# Patient Record
Sex: Female | Born: 1980 | Race: White | Hispanic: No | Marital: Single | Smoking: Former smoker
Health system: Southern US, Community
[De-identification: ages and names within clinical notes are randomized; demographics above are authoritative.]

## PROBLEM LIST (undated history)

## (undated) DIAGNOSIS — J45909 Unspecified asthma, uncomplicated: Secondary | ICD-10-CM

---

## 2012-04-18 ENCOUNTER — Emergency Department (INDEPENDENT_AMBULATORY_CARE_PROVIDER_SITE_OTHER): Payer: Medicaid Other

## 2012-04-18 ENCOUNTER — Encounter (HOSPITAL_COMMUNITY): Payer: Self-pay

## 2012-04-18 ENCOUNTER — Emergency Department (HOSPITAL_COMMUNITY)
Admission: EM | Admit: 2012-04-18 | Discharge: 2012-04-18 | Disposition: A | Discharge status: Home / Self Care | Payer: Medicaid Other | Source: Home / Self Care | Attending: Emergency Medicine | Admitting: Emergency Medicine

## 2012-04-18 ENCOUNTER — Emergency Department (HOSPITAL_COMMUNITY): Admission: EM | Admit: 2012-04-18 | Discharge: 2012-04-18 | Discharge status: Against Medical Advice | Payer: Self-pay

## 2012-04-18 DIAGNOSIS — S93409A Sprain of unspecified ligament of unspecified ankle, initial encounter: Secondary | ICD-10-CM

## 2012-04-18 MED ORDER — ONDANSETRON 4 MG PO TBDP
ORAL_TABLET | ORAL | Status: AC
  Filled 2012-04-18: qty 2

## 2012-04-18 MED ORDER — HYDROCODONE-ACETAMINOPHEN 5-325 MG PO TABS
1.0000 | ORAL_TABLET | Freq: Once | ORAL | Status: AC
Start: 2012-04-18 — End: 2012-04-18
  Administered 2012-04-18: 1 via ORAL

## 2012-04-18 MED ORDER — HYDROCODONE-ACETAMINOPHEN 5-325 MG PO TABS: ORAL_TABLET | ORAL | Status: DC

## 2012-04-18 MED ORDER — HYDROCODONE-ACETAMINOPHEN 5-325 MG PO TABS
ORAL_TABLET | ORAL | Status: AC
  Filled 2012-04-18: qty 1

## 2012-04-18 MED ORDER — ONDANSETRON 8 MG PO TBDP: 8.0000 mg | ORAL_TABLET | Freq: Three times a day (TID) | ORAL | Status: DC | PRN

## 2012-04-18 MED ORDER — ONDANSETRON 4 MG PO TBDP
8.0000 mg | ORAL_TABLET | Freq: Once | ORAL | Status: AC
  Administered 2012-04-18: 8 mg via ORAL

## 2012-04-18 NOTE — ED Provider Notes (Signed)
Chief Complaint  Patient presents with  . Foot Injury    History of Present Illness:   Sabrina Gonzalez is a 32 year old female who injured her right ankle last night. She had her foot propped up in a recliner, when her young son jumped and landed on her ankle. It was somewhat painful thereafter and swollen. It hurts to walk and she had a great deal difficulty bearing weight. Her toes are little numb and tingly. Her son did the same thing again this morning and reinjured it again. This made it hurt even worse.  Review of Systems:  Other than noted above, the patient denies any of the following symptoms: Systemic:  No fevers, chills, sweats, or aches.   Musculoskeletal:  No joint pain, arthritis, bursitis, swelling, back pain, or neck pain. Neurological:  No muscular weakness or paresthesias.  PMFSH:  Past medical history, family history, social history, meds, and allergies were reviewed.  Physical Exam:   Vital signs:  BP 109/75  Pulse 70  Temp 97.6 F (36.4 C) (Oral)  Resp 16  Ht 5\' 10"  (1.778 m)  Wt 200 lb (90.719 kg)  BMI 28.70 kg/m2  SpO2 99%  LMP 04/06/2012 Gen:  Alert and oriented times 3.  In no distress. Musculoskeletal: Exam of the ankle reveals slight swelling both medially and laterally. There was pain to palpation just beneath the lateral malleolus and anteriorly, but not over the foot itself. There was no bruising or deformity. Anterior drawer sign was negative.  Talar tilt was negative. Squeeze test was negative. Achilles tendon, peroneal tendon, and tibialis posterior were intact. Otherwise, all joints had a full a ROM with no swelling, bruising or deformity.  No edema, pulses full. Extremities were warm and pink.  Capillary refill was brisk.  Skin:  Clear, warm and dry.  No rash. Neuro:  Alert and oriented times 3.  Muscle strength was normal.  Sensation was intact to light touch.   Radiology:  Dg Ankle Complete Right  04/18/2012  *RADIOLOGY REPORT*  Clinical Data:  32 year old female with right ankle pain following injury.  RIGHT ANKLE - COMPLETE 3+ VIEW  Comparison: None  Findings: No evidence of acute fracture, subluxation or dislocation identified.  No radio-opaque foreign bodies are present.  No focal bony lesions are noted.  The joint spaces are unremarkable.  IMPRESSION: No evidence of acute bony abnormality.   Original Report Authenticated By: Harmon Pier, M.D.    I reviewed the images independently and personally and concur with the radiologist's findings.  Course in Urgent Care Center:   She was placed in an ASO brace and given crutches for ambulation. For the pain she was given hydrocodone/APAP 5/325 one by mouth and since this usually makes her a little nauseated, Zofran ODT 8 mg sublingually.  Assessment:  The encounter diagnosis was Ankle sprain.  Plan:   1.  The following meds were prescribed:   New Prescriptions   HYDROCODONE-ACETAMINOPHEN (NORCO/VICODIN) 5-325 MG PER TABLET    1 to 2 tabs every 4 to 6 hours as needed for pain.   ONDANSETRON (ZOFRAN ODT) 8 MG DISINTEGRATING TABLET    Take 1 tablet (8 mg total) by mouth every 8 (eight) hours as needed for nausea.   2.  The patient was instructed in symptomatic care, including rest and activity, elevation, application of ice and compression.  Appropriate handouts were given. 3.  The patient was told to return if becoming worse in any way, if no better in 3 or 4  days, and given some red flag symptoms that would indicate earlier return.   4.  The patient was told to follow up with Dr. Jodi Geralds if no better in 2 weeks.    Reuben Likes, MD 04/18/12 [phone]

## 2012-04-18 NOTE — ED Notes (Signed)
States her son jumped and landed on her right foot/ankle last night and again today ; minimal swelling , painful to palpation ; good dorsal pedal and posterior tibial pulses; echymosis absent

## 2012-06-21 ENCOUNTER — Emergency Department (HOSPITAL_COMMUNITY)
Admission: EM | Admit: 2012-06-21 | Discharge: 2012-06-22 | Disposition: A | Discharge status: Home / Self Care | Payer: Medicaid Other | Attending: Emergency Medicine | Admitting: Emergency Medicine

## 2012-06-21 ENCOUNTER — Encounter (HOSPITAL_COMMUNITY): Payer: Self-pay | Admitting: *Deleted

## 2012-06-21 DIAGNOSIS — R1084 Generalized abdominal pain: Secondary | ICD-10-CM | POA: Insufficient documentation

## 2012-06-21 DIAGNOSIS — J45909 Unspecified asthma, uncomplicated: Secondary | ICD-10-CM | POA: Insufficient documentation

## 2012-06-21 DIAGNOSIS — Z3202 Encounter for pregnancy test, result negative: Secondary | ICD-10-CM | POA: Insufficient documentation

## 2012-06-21 DIAGNOSIS — Z87891 Personal history of nicotine dependence: Secondary | ICD-10-CM | POA: Insufficient documentation

## 2012-06-21 DIAGNOSIS — R109 Unspecified abdominal pain: Secondary | ICD-10-CM

## 2012-06-21 HISTORY — DX: Unspecified asthma, uncomplicated: J45.909

## 2012-06-21 LAB — URINALYSIS, ROUTINE W REFLEX MICROSCOPIC
Ketones, ur: 15 mg/dL — AB
Leukocytes, UA: NEGATIVE
Nitrite: NEGATIVE
Protein, ur: NEGATIVE mg/dL
Urobilinogen, UA: 1 mg/dL (ref 0.0–1.0)

## 2012-06-21 LAB — CBC WITH DIFFERENTIAL/PLATELET
Basophils Relative: 1 % (ref 0–1)
Eosinophils Relative: 1 % (ref 0–5)
HCT: 38.7 % (ref 36.0–46.0)
Hemoglobin: 13.5 g/dL (ref 12.0–15.0)
MCHC: 34.9 g/dL (ref 30.0–36.0)
MCV: 87.6 fL (ref 78.0–100.0)
Monocytes Absolute: 0.6 10*3/uL (ref 0.1–1.0)
Monocytes Relative: 10 % (ref 3–12)
Neutro Abs: 4.1 10*3/uL (ref 1.7–7.7)
RDW: 13.1 % (ref 11.5–15.5)
WBC: 6.2 10*3/uL (ref 4.0–10.5)

## 2012-06-21 LAB — COMPREHENSIVE METABOLIC PANEL
BUN: 12 mg/dL (ref 6–23)
CO2: 26 mEq/L (ref 19–32)
Calcium: 9.3 mg/dL (ref 8.4–10.5)
Chloride: 101 mEq/L (ref 96–112)
Creatinine, Ser: 0.8 mg/dL (ref 0.50–1.10)
GFR calc non Af Amer: >90 mL/min (ref >90)
Total Bilirubin: 0.4 mg/dL (ref 0.3–1.2)

## 2012-06-21 LAB — LIPASE, BLOOD: Lipase: 14 U/L (ref 11–59)

## 2012-06-21 NOTE — ED Notes (Signed)
Abdominal pain for over a week 

## 2012-06-21 NOTE — ED Provider Notes (Signed)
History     This chart was scribed for Geoffery Lyons, MD, MD by Smitty Pluck, ED Scribe. The patient was seen in room [facility]10/[facility] and the patient's care was started at 11:18PM.   CSN: [id number]  Arrival date & time 06/21/12  2232      Chief Complaint  Patient presents with  . Abdominal Pain     The history is provided by the patient and a friend. No language interpreter was used.   Sabrina Gonzalez is a 32 y.o. female who presents to the Emergency Department complaining of constant, moderate generalized abdominal pain onset 1.5 weeks. She states that the pain is intermittently sharp and sometimes it is an aching pain. She states that she had abdominal cramps initially but those subsided since onset. She reports that she has lost 15 lbs within the last 1.5 weeks. She reports that she has decreased appetite. Pt reports that she was seen at Urgent Care and was told she had a virus. Pt denies blood in stool,  Fever, back pain, chills, nausea, vomiting, diarrhea, weakness, cough, SOB and any other pain. She denies hx of similar pain and abdominal surgery.    Past Medical History  Diagnosis Date  . Asthma     History reviewed. No pertinent past surgical history.  No family history on file.  History  Substance Use Topics  . Smoking status: Former Games developer  . Smokeless tobacco: Not on file  . Alcohol Use: Yes     Comment: occassionally    OB History   Grav Para Term Preterm Abortions TAB SAB Ect Mult Living                  Review of Systems 10 Systems reviewed and all are negative for acute change except as noted in the HPI.   Allergies  Review of patient's allergies indicates no known allergies.  Home Medications  No current outpatient prescriptions on file.  BP 120/72  Pulse 86  Temp(Src) 97.5 F (36.4 C) (Oral)  Resp 20  Ht 5\' 10"  (1.778 m)  Wt 205 lb (92.987 kg)  BMI 29.41 kg/m2  SpO2 100%  LMP 05/24/2012  Physical Exam  Nursing note and vitals  reviewed. Constitutional: She is oriented to person, place, and time. She appears well-developed and well-nourished. No distress.  HENT:  Head: Normocephalic and atraumatic.  Eyes: EOM are normal. Pupils are equal, round, and reactive to light.  Neck: Normal range of motion. Neck supple. No tracheal deviation present.  Cardiovascular: Normal rate.   Pulmonary/Chest: Effort normal. No respiratory distress.  Abdominal: Soft. She exhibits no distension. There is no rebound and no guarding.  Mild tenderness to palpation in epigastrium    Musculoskeletal: Normal range of motion.  Neurological: She is alert and oriented to person, place, and time.  Skin: Skin is warm and dry.  Psychiatric: She has a normal mood and affect. Her behavior is normal.    ED Course  Procedures (including critical care time) DIAGNOSTIC STUDIES: Oxygen Saturation is 100% on room air, normal by my interpretation.    COORDINATION OF CARE: 11:21 PM Discussed ED treatment with pt and pt agrees.     Labs Reviewed - No data to display No results found.   No diagnosis found.    MDM  The labs and urine are unremarkable.  She appears clinically well and the exam is benign.  The pain is epigastric and I believe the possibility of a gallbladder issue should be entertained.  Will order an ultrasound for the morning, follow up prn if she worsens.       I personally performed the services described in this documentation, which was scribed in my presence. The recorded information has been reviewed and is accurate.      Geoffery Lyons, MD 06/22/12 [phone]

## 2012-06-22 ENCOUNTER — Other Ambulatory Visit (HOSPITAL_COMMUNITY): Payer: Self-pay | Admitting: Emergency Medicine

## 2012-06-22 ENCOUNTER — Ambulatory Visit (HOSPITAL_COMMUNITY)
Admit: 2012-06-22 | Discharge: 2012-06-22 | Disposition: A | Discharge status: Home / Self Care | Payer: Medicaid Other | Source: Ambulatory Visit | Attending: Emergency Medicine | Admitting: Emergency Medicine

## 2012-06-22 DIAGNOSIS — R52 Pain, unspecified: Secondary | ICD-10-CM

## 2012-06-22 DIAGNOSIS — R109 Unspecified abdominal pain: Secondary | ICD-10-CM | POA: Insufficient documentation

## 2012-06-22 MED ORDER — HYDROCODONE-ACETAMINOPHEN 5-325 MG PO TABS: 2.0000 | ORAL_TABLET | ORAL | Status: DC | PRN

## 2012-06-22 MED ORDER — GI COCKTAIL ~~LOC~~
30.0000 mL | Freq: Once | ORAL | Status: AC
  Administered 2012-06-22: 30 mL via ORAL
  Filled 2012-06-22: qty 30

## 2012-06-22 MED ORDER — ONDANSETRON 4 MG PO TBDP: ORAL_TABLET | ORAL | Status: DC

## 2012-11-02 ENCOUNTER — Emergency Department (HOSPITAL_COMMUNITY)
Admission: EM | Admit: 2012-11-02 | Discharge: 2012-11-02 | Disposition: A | Discharge status: Home / Self Care | Payer: Medicaid Other | Attending: Emergency Medicine | Admitting: Emergency Medicine

## 2012-11-02 ENCOUNTER — Encounter (HOSPITAL_COMMUNITY): Payer: Self-pay

## 2012-11-02 ENCOUNTER — Emergency Department (HOSPITAL_COMMUNITY): Payer: Medicaid Other

## 2012-11-02 DIAGNOSIS — J45909 Unspecified asthma, uncomplicated: Secondary | ICD-10-CM | POA: Insufficient documentation

## 2012-11-02 DIAGNOSIS — R0789 Other chest pain: Secondary | ICD-10-CM | POA: Insufficient documentation

## 2012-11-02 DIAGNOSIS — M79609 Pain in unspecified limb: Secondary | ICD-10-CM | POA: Insufficient documentation

## 2012-11-02 DIAGNOSIS — R110 Nausea: Secondary | ICD-10-CM | POA: Insufficient documentation

## 2012-11-02 DIAGNOSIS — Z87891 Personal history of nicotine dependence: Secondary | ICD-10-CM | POA: Insufficient documentation

## 2012-11-02 LAB — CBC WITH DIFFERENTIAL/PLATELET
Basophils Absolute: 0 10*3/uL (ref 0.0–0.1)
Basophils Relative: 0 % (ref 0–1)
Eosinophils Absolute: 0.1 10*3/uL (ref 0.0–0.7)
Eosinophils Relative: 2 % (ref 0–5)
Lymphs Abs: 1.6 10*3/uL (ref 0.7–4.0)
MCH: 30.7 pg (ref 26.0–34.0)
MCHC: 34.7 g/dL (ref 30.0–36.0)
MCV: 88.4 fL (ref 78.0–100.0)
Platelets: 241 10*3/uL (ref 150–400)
RDW: 13.1 % (ref 11.5–15.5)

## 2012-11-02 LAB — BASIC METABOLIC PANEL
Calcium: 9.4 mg/dL (ref 8.4–10.5)
GFR calc Af Amer: >90 mL/min (ref >90)
GFR calc non Af Amer: >90 mL/min (ref >90)
Glucose, Bld: 97 mg/dL (ref 70–99)
Sodium: 138 mEq/L (ref 135–145)

## 2012-11-02 MED ORDER — LORAZEPAM 1 MG PO TABS
1.0000 mg | ORAL_TABLET | Freq: Once | ORAL | Status: AC
  Administered 2012-11-02: 1 mg via ORAL
  Filled 2012-11-02: qty 1

## 2012-11-02 MED ORDER — TRAMADOL HCL 50 MG PO TABS: 50.0000 mg | ORAL_TABLET | Freq: Four times a day (QID) | ORAL | Status: AC | PRN

## 2012-11-02 MED ORDER — KETOROLAC TROMETHAMINE 60 MG/2ML IM SOLN
60.0000 mg | Freq: Once | INTRAMUSCULAR | Status: AC
  Administered 2012-11-02: 60 mg via INTRAMUSCULAR
  Filled 2012-11-02: qty 2

## 2012-11-02 NOTE — Progress Notes (Signed)
ED/CM saw pt had  No PCP listed. Spoke with pt and she is waiting for a new medicaid card, which should have her new PCP on it.  [facility]  [employer] given

## 2012-11-02 NOTE — ED Provider Notes (Signed)
CSN: [id number]     Arrival date & time 11/02/12  0957 History     First MD Initiated Contact with Patient 11/02/12 1000     Chief Complaint  Patient presents with  . Chest Pain   (Consider location/radiation/quality/duration/timing/severity/associated sxs/prior Treatment) HPI Comments: Sabrina Gonzalez is a 32 y.o. Female presenting with sudden onset of left sided chest and axilla pain which started during a verbal altercation around 11 pm last night.  Her pain is improved with applying pressure at the site and worsened with deep inspiration and when she crosses her left arm across her chest.  She denies sob, but pain is pleuritic and worsened cough and deep inspiration.  She denies weakness, dizziness, abdominal pain.  She states the pain is making her nauseated,  But not currently.  She has had no recent periods of being sedentary, denies leg swelling or pain.  She has had no fevers, chills or cough. She has no primary family history of cardiac history at a young age.  She does have a history of prior panic attacks, but not with accompanying chest pain.     The history is provided by the patient.    Past Medical History  Diagnosis Date  . Asthma    History reviewed. No pertinent past surgical history. No family history on file. History  Substance Use Topics  . Smoking status: Former Games developer  . Smokeless tobacco: Not on file  . Alcohol Use: No   OB History   Grav Para Term Preterm Abortions TAB SAB Ect Mult Living                 Review of Systems  Constitutional: Negative for fever.  HENT: Negative for congestion, sore throat and neck pain.   Eyes: Negative.   Respiratory: Negative for chest tightness and shortness of breath.   Cardiovascular: Positive for chest pain.  Gastrointestinal: Positive for nausea. Negative for abdominal pain.  Genitourinary: Negative.   Musculoskeletal: Negative for joint swelling and arthralgias.  Skin: Negative.  Negative for rash and wound.   Neurological: Negative for dizziness, weakness, light-headedness, numbness and headaches.  Psychiatric/Behavioral: Negative.     Allergies  Codeine  Home Medications   Current Outpatient Rx  Name  Route  Sig  Dispense  Refill  . traMADol (ULTRAM) 50 MG tablet   Oral   Take 1 tablet (50 mg total) by mouth every 6 (six) hours as needed for pain.   15 tablet   0    BP 113/88  Pulse 69  Temp(Src) 98.6 F (37 C) (Oral)  Resp 18  Ht 5\' 10"  (1.778 m)  Wt 204 lb (92.534 kg)  BMI 29.27 kg/m2  SpO2 100%  LMP 11/02/2012 Physical Exam  Nursing note and vitals reviewed. Constitutional: She appears well-developed and well-nourished.  Appears anxious, splinting her left chest/axilla.  HENT:  Head: Normocephalic and atraumatic.  Eyes: Conjunctivae are normal.  Neck: Normal range of motion.  Cardiovascular: Normal rate, regular rhythm, normal heart sounds and intact distal pulses.   Pulmonary/Chest: Effort normal and breath sounds normal. She has no wheezes. She has no rales. She exhibits no tenderness.  Abdominal: Soft. Bowel sounds are normal. She exhibits no distension. There is no tenderness. There is no rebound.  Musculoskeletal: Normal range of motion. She exhibits no edema and no tenderness.  Neurological: She is alert.  Skin: Skin is warm and dry. No erythema.  Psychiatric: She has a normal mood and affect.    ED  Course   Procedures (including critical care time)   Date: 11/02/2012  Rate: 67  Rhythm: normal sinus rhythm  QRS Axis: normal  Intervals: normal  ST/T Wave abnormalities: normal  Conduction Disutrbances:none  Narrative Interpretation:   Old EKG Reviewed: none available    Labs Reviewed  TROPONIN I  CBC WITH DIFFERENTIAL  BASIC METABOLIC PANEL   Dg Chest Port 1 View  11/02/2012   *RADIOLOGY REPORT*  Clinical Data: Chest pain.  PORTABLE CHEST - 1 VIEW  Comparison: None  Findings: The cardiac silhouette, mediastinal and hilar contours are within  normal limits.  The lungs are clear.  No pleural effusion.  The bony thorax is intact.  IMPRESSION: No acute cardiopulmonary findings.   Original Report Authenticated By: Rudie Meyer, M.D.   1. Chest pain, atypical     MDM  Patients labs and/or radiological studies were viewed and considered during the medical decision making and disposition process. Discussed with Dr Judd Lien prior to dc home.  Labs , ekg normal.  Pt has normal VS, no risk factors for PE.  Suspect sx related to anxiety.  Referrals given for obtaining pcp.  The patient appears reasonably screened and/or stabilized for discharge and I doubt any other medical condition or other [facility] requiring further screening, evaluation, or treatment in the ED at this time prior to discharge.    Burgess Amor, PA-C 11/02/12 1153

## 2012-11-02 NOTE — ED Notes (Signed)
Pt denies feeling any weakness or dizziness.

## 2012-11-02 NOTE — ED Provider Notes (Signed)
Medical screening examination/treatment/procedure(s) were performed by non-physician practitioner and as supervising physician I was immediately available for consultation/collaboration.  Geoffery Lyons, MD 11/02/12 1452

## 2012-11-02 NOTE — ED Notes (Signed)
Pt c/o pain in left chest and under left arm since last night.  Denies injury, denies any heavy lifting/pulling.  Reports pain feels better when she applies pressure over the area.  Reports pain is making her nauseated and SOB.  Reports movement, cough, and deep breathing increase the pain.

## 2013-07-10 ENCOUNTER — Encounter (HOSPITAL_COMMUNITY): Payer: Self-pay | Admitting: Emergency Medicine

## 2013-07-10 ENCOUNTER — Emergency Department (INDEPENDENT_AMBULATORY_CARE_PROVIDER_SITE_OTHER)
Admission: EM | Admit: 2013-07-10 | Discharge: 2013-07-10 | Disposition: A | Discharge status: Home / Self Care | Payer: Medicaid Other | Source: Home / Self Care | Attending: Family Medicine | Admitting: Family Medicine

## 2013-07-10 DIAGNOSIS — K5289 Other specified noninfective gastroenteritis and colitis: Secondary | ICD-10-CM

## 2013-07-10 DIAGNOSIS — K529 Noninfective gastroenteritis and colitis, unspecified: Secondary | ICD-10-CM

## 2013-07-10 LAB — POCT I-STAT, CHEM 8
BUN: 4 mg/dL — AB (ref 6–23)
CALCIUM ION: 1.16 mmol/L (ref 1.12–1.23)
CHLORIDE: 101 meq/L (ref 96–112)
CREATININE: 0.8 mg/dL (ref 0.50–1.10)
Glucose, Bld: 107 mg/dL — ABNORMAL HIGH (ref 70–99)
HCT: 46 % (ref 36.0–46.0)
Hemoglobin: 15.6 g/dL — ABNORMAL HIGH (ref 12.0–15.0)
Potassium: 3.3 mEq/L — ABNORMAL LOW (ref 3.7–5.3)
SODIUM: 140 meq/L (ref 137–147)
TCO2: 24 mmol/L (ref 0–100)

## 2013-07-10 NOTE — Discharge Instructions (Signed)
Clear liquid , bland diet todayt as tolerated, advance on wed as improved,  return or see your doctor if any problems.

## 2013-07-10 NOTE — ED Notes (Signed)
Pt  Reports  Symptoms  Of  Body  Aches     With  Cough  And  Fever  Earlier        -  She  States  Last  Week  She  Had  Gi  Symptoms  But  At this  Time     The symptoms  Seem more  resp

## 2013-07-10 NOTE — ED Provider Notes (Signed)
CSN: [id number]     Arrival date & time 07/10/13  1023 History   First MD Initiated Contact with Patient 07/10/13 1256     Chief Complaint  Patient presents with  . URI   (Consider location/radiation/quality/duration/timing/severity/associated sxs/prior Treatment) Patient is a 33 y.o. female presenting with URI. The history is provided by the patient.  URI Presenting symptoms: congestion, cough, fatigue, fever and rhinorrhea   Severity:  Mild Onset quality:  Gradual Duration:  5 days Progression:  Worsening Chronicity:  New Associated symptoms: myalgias and sneezing   Associated symptoms: no wheezing   Risk factors: sick contacts   Risk factors comment:  Son sick with flu , pt with n/v/d last week.   Past Medical History  Diagnosis Date  . Asthma    History reviewed. No pertinent past surgical history. History reviewed. No pertinent family history. History  Substance Use Topics  . Smoking status: Former Games developermoker  . Smokeless tobacco: Not on file  . Alcohol Use: No   OB History   Grav Para Term Preterm Abortions TAB SAB Ect Mult Living                 Review of Systems  Constitutional: Positive for fever and fatigue.  HENT: Positive for congestion, postnasal drip, rhinorrhea and sneezing.   Respiratory: Positive for cough. Negative for wheezing.   Gastrointestinal: Positive for nausea, vomiting and diarrhea.  Musculoskeletal: Positive for myalgias.  Neurological: Positive for weakness.    Allergies  Codeine  Home Medications   Prior to Admission medications   Medication Sig Start Date End Date Taking? Authorizing Provider  traMADol (ULTRAM) 50 MG tablet Take 1 tablet (50 mg total) by mouth every 6 (six) hours as needed for pain. 11/02/12   Burgess AmorJulie Idol, PA-C   BP 114/82  Pulse 127  Temp(Src) 97.9 F (36.6 C) (Oral)  Resp 16  SpO2 100%  LMP 06/27/2013 Physical Exam  Nursing note and vitals reviewed. Constitutional: She is oriented to person, place, and time.  She appears well-developed and well-nourished.  HENT:  Right Ear: External ear normal.  Left Ear: External ear normal.  Nose: Nose normal.  Mouth/Throat: Oropharynx is clear and moist.  Eyes: Conjunctivae and EOM are normal. Pupils are equal, round, and reactive to light.  Neck: Normal range of motion. Neck supple.  Cardiovascular: Regular rhythm and normal heart sounds.   Pulmonary/Chest: Effort normal and breath sounds normal.  Abdominal: Soft. Bowel sounds are normal. She exhibits no distension and no mass. There is no tenderness. There is no rebound and no guarding.  Lymphadenopathy:    She has no cervical adenopathy.  Neurological: She is alert and oriented to person, place, and time.  Skin: Skin is warm and dry. No rash noted.    ED Course  Procedures (including critical care time) Labs Review Labs Reviewed  POCT I-STAT, CHEM 8 - Abnormal; Notable for the following:    Potassium 3.3 (*)    BUN 4 (*)    Glucose, Bld 107 (*)    Hemoglobin 15.6 (*)    All other components within normal limits    Results for orders placed during the hospital encounter of 07/10/13  POCT I-STAT, CHEM 8      Result Value Ref Range   Sodium 140  137 - 147 mEq/L   Potassium 3.3 (*) 3.7 - 5.3 mEq/L   Chloride 101  96 - 112 mEq/L   BUN 4 (*) 6 - 23 mg/dL   Creatinine, Ser  0.80  0.50 - 1.10 mg/dL   Glucose, Bld 161107 (*) 70 - 99 mg/dL   Calcium, Ion 0.961.16  0.451.12 - 1.23 mmol/L   TCO2 24  0 - 100 mmol/L   Hemoglobin 15.6 (*) 12.0 - 15.0 g/dL   HCT 40.946.0  81.136.0 - 91.446.0 %   Imaging Review No results found.   MDM   1. Gastroenteritis, acute        Linna HoffJames D Brandun Pinn, MD 07/10/13 1359

## 2013-12-10 IMAGING — US US ABDOMEN LIMITED
1 series · 14 of 25 positions shown · non-contrast
Comparison: None

CLINICAL DATA: Abdominal pain question cholelithiasis

LIMITED ABDOMINAL ULTRASOUND - RIGHT UPPER QUADRANT

[Series 1: us abdomen limited · 0.25mm/px · 14 of 144 slices shown]
[im 1/144]
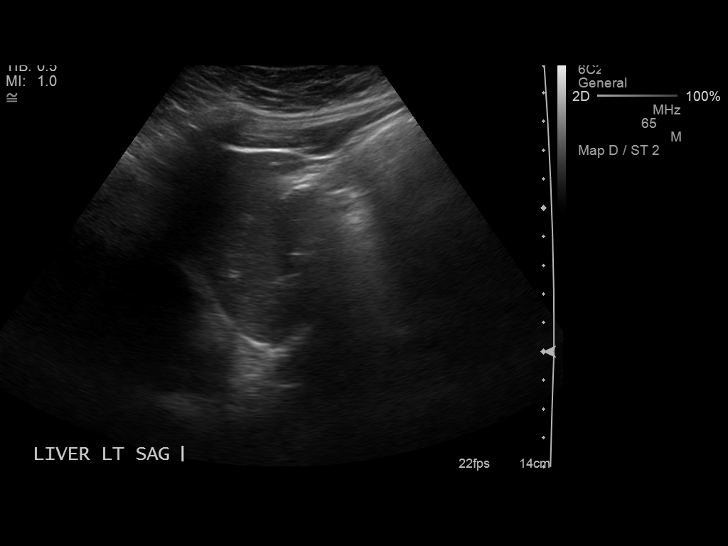
[im 12/144]
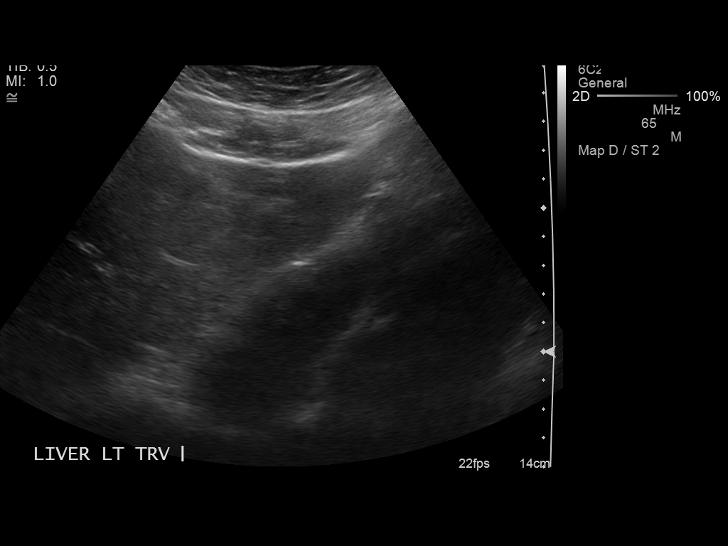
[im 24/144]
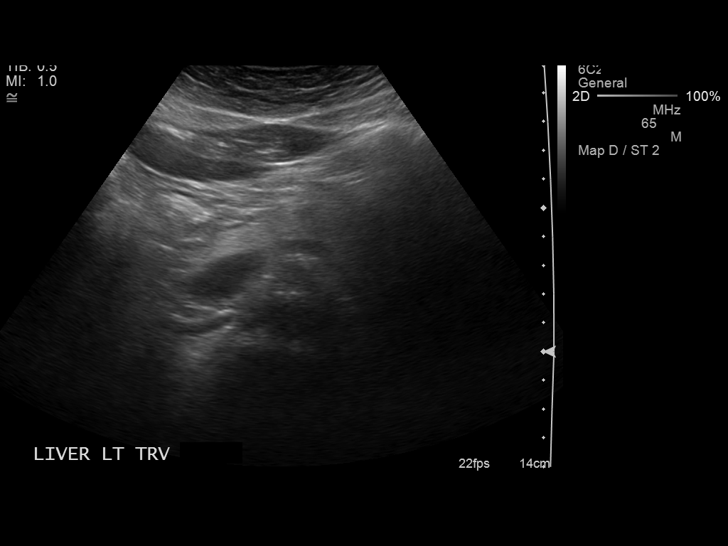
[im 36/144]
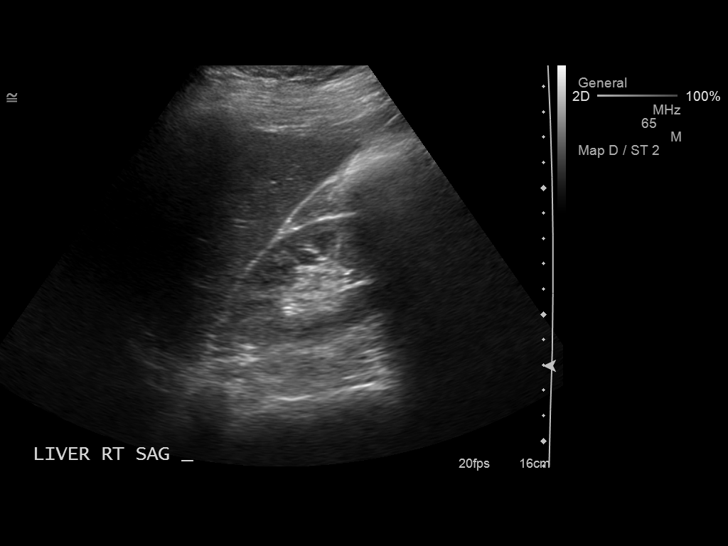
[im 48/144]
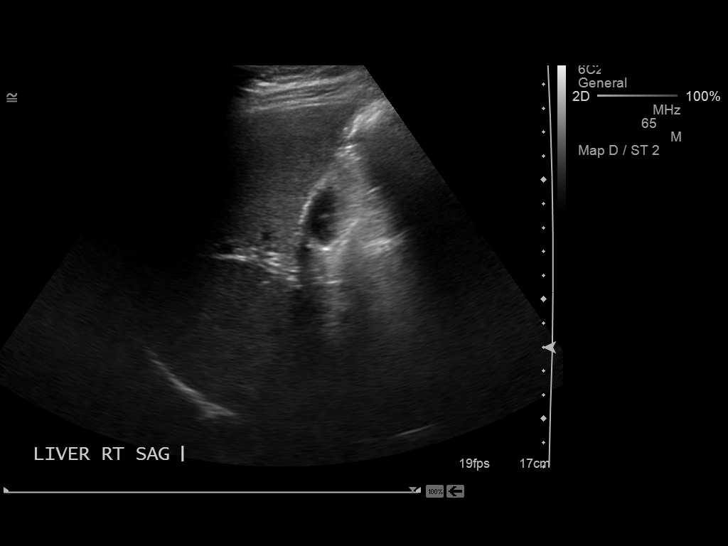
[im 54/144]
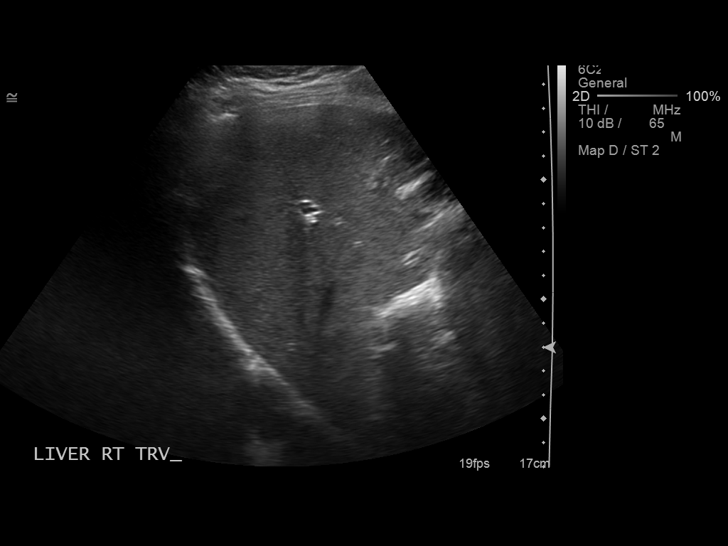
[im 66/144]
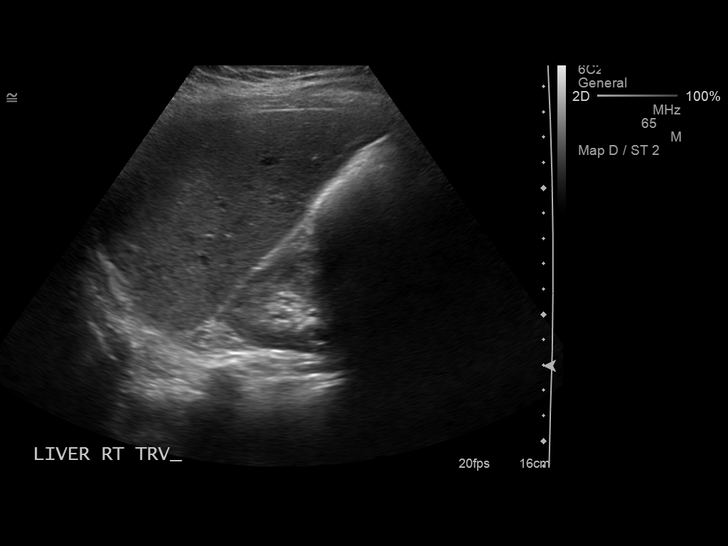
[im 78/144]
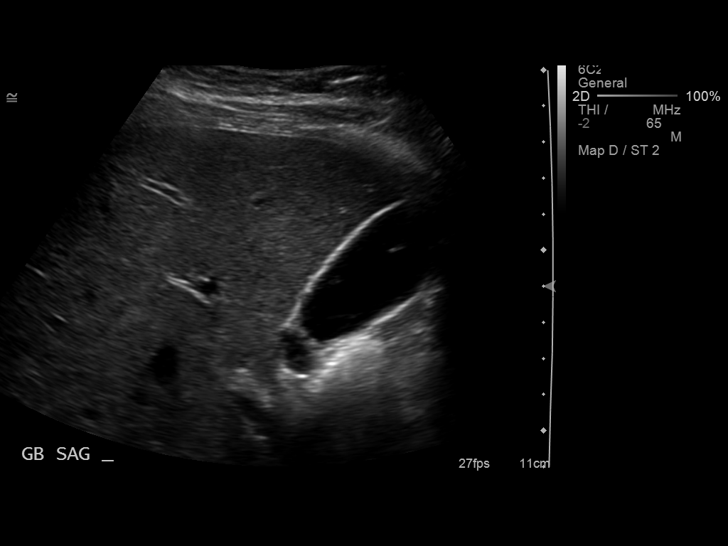
[im 90/144]
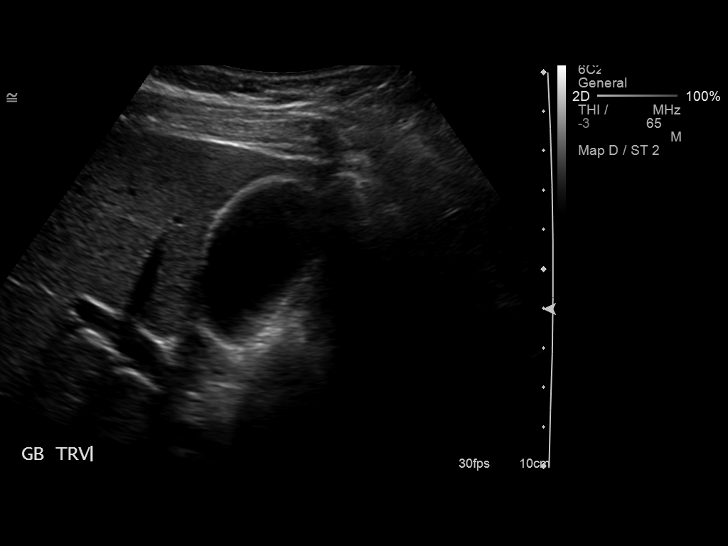
[im 96/144]
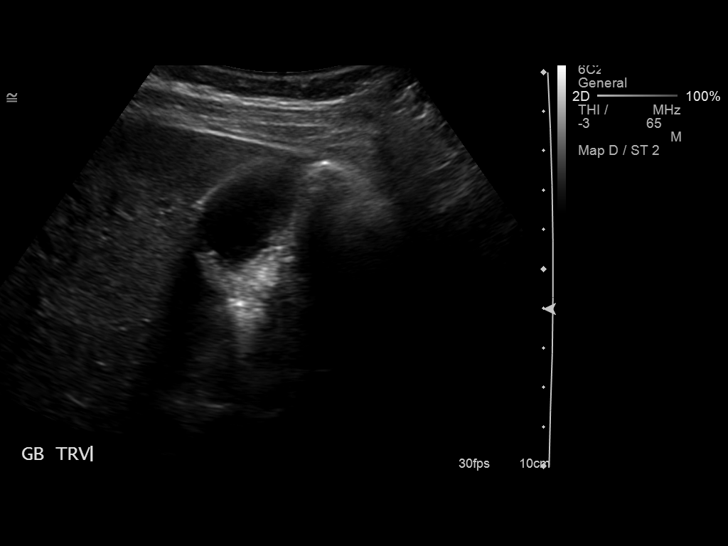
[im 108/144]
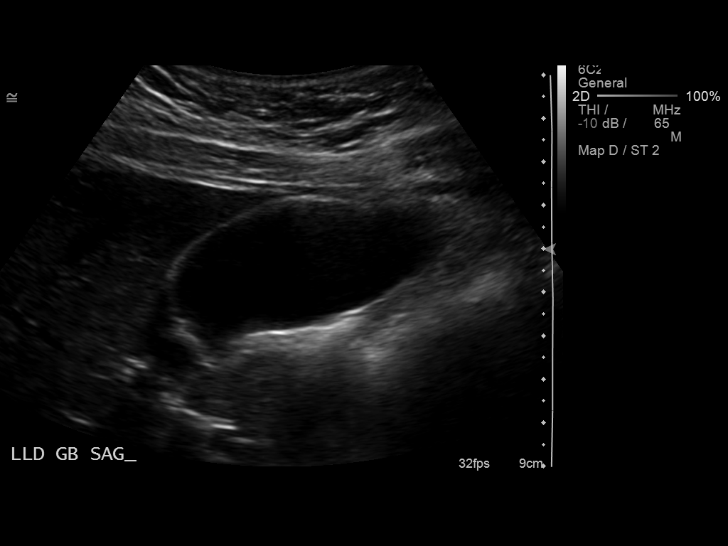
[im 120/144]
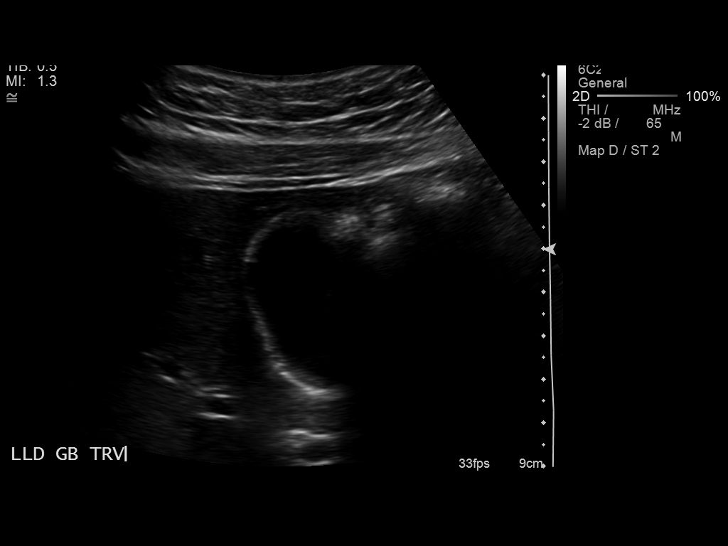
[im 132/144]
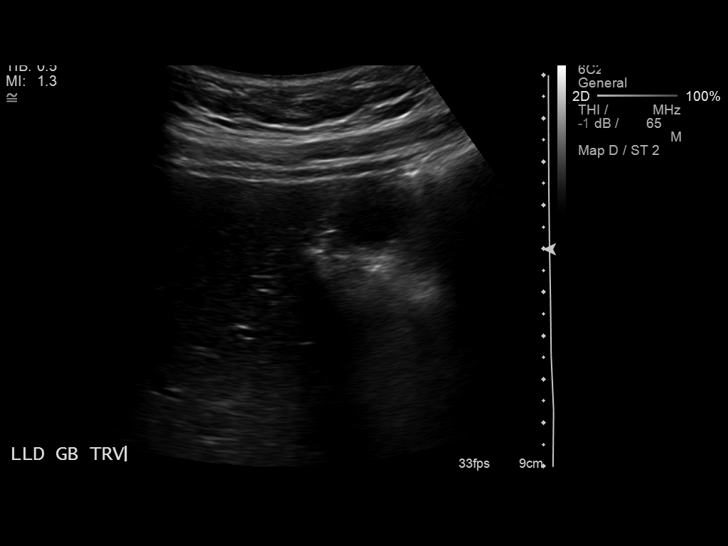
[im 144/144]
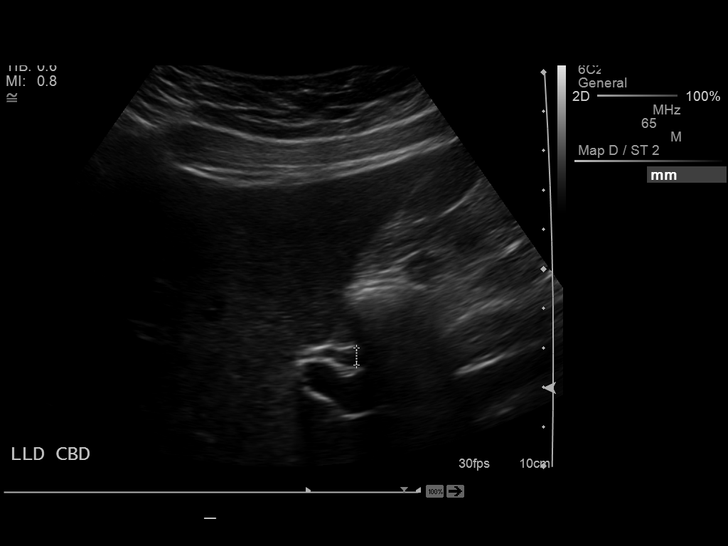

[14 of 25 positions shown; findings below may reference images not displayed]

FINDINGS: Gallbladder:  Normally distended without stones or wall thickening.
No pericholecystic fluid or sonographic Murphy sign.

Common bile duct:  3 mm diameter, normal

Liver:  Normal appearance

No right upper quadrant ascites.

Visualized portion of pancreas normal appearance.
IMPRESSION: Normal limited right upper quadrant ultrasound.

## 2014-04-22 IMAGING — CR DG CHEST 1V PORT
1 series · 1 of 1 positions shown · non-contrast
Comparison: None

CLINICAL DATA: Chest pain.

PORTABLE CHEST - 1 VIEW

[portable]
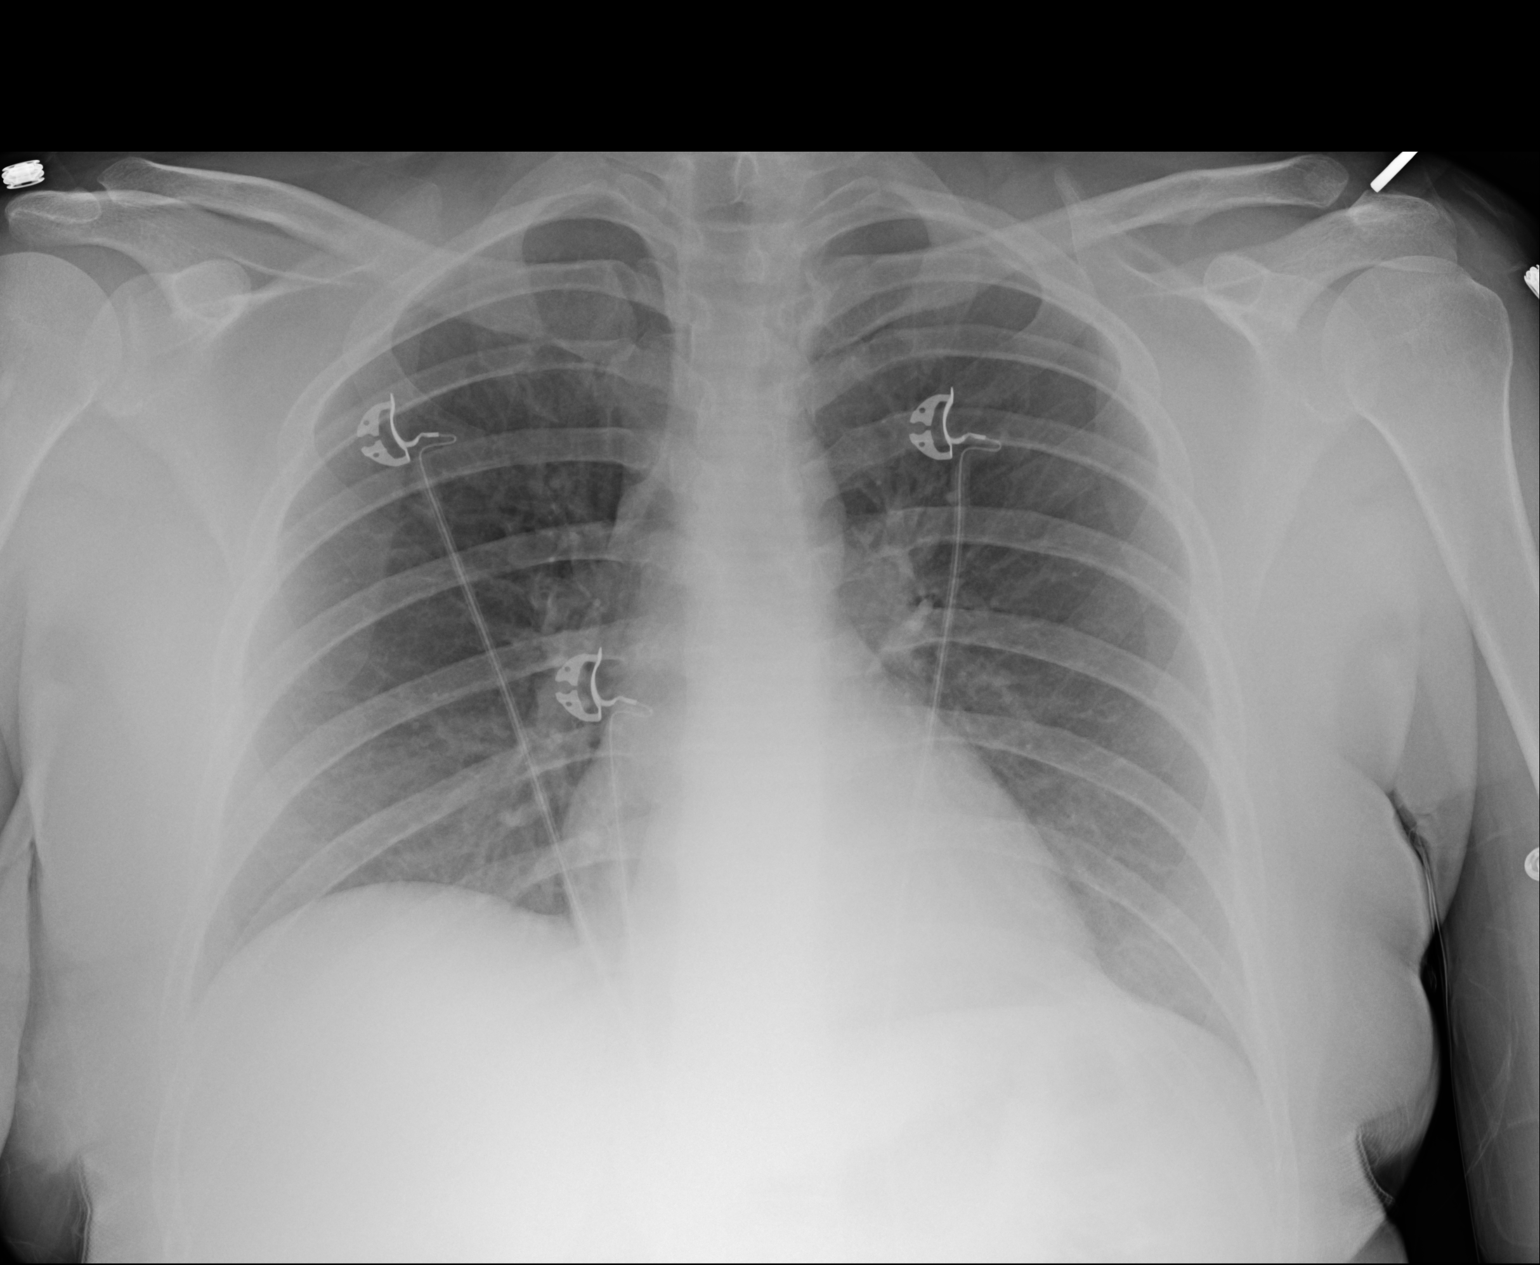

[1 of 1 positions shown; findings below may reference images not displayed]

FINDINGS: The cardiac silhouette, mediastinal and hilar contours
are within normal limits.  The lungs are clear.  No pleural
effusion.  The bony thorax is intact.
IMPRESSION: No acute cardiopulmonary findings.
